# Patient Record
Sex: Male | Born: 1990 | Race: Black or African American | Hispanic: No | Marital: Single | Smoking: Current every day smoker
Health system: Southern US, Community
[De-identification: ages and names within clinical notes are randomized; demographics above are authoritative.]

## PROBLEM LIST (undated history)

## (undated) DIAGNOSIS — S0183XA Puncture wound without foreign body of other part of head, initial encounter: Secondary | ICD-10-CM

## (undated) DIAGNOSIS — R112 Nausea with vomiting, unspecified: Secondary | ICD-10-CM

## (undated) DIAGNOSIS — Z9889 Other specified postprocedural states: Secondary | ICD-10-CM

## (undated) DIAGNOSIS — W3400XA Accidental discharge from unspecified firearms or gun, initial encounter: Secondary | ICD-10-CM

---

## 2011-02-09 ENCOUNTER — Emergency Department (HOSPITAL_COMMUNITY)

## 2011-02-09 ENCOUNTER — Inpatient Hospital Stay (HOSPITAL_COMMUNITY)
Admission: EM | Admit: 2011-02-09 | Discharge: 2011-02-12 | DRG: 580 | Disposition: A | Discharge status: Home / Self Care | Attending: General Surgery | Admitting: General Surgery

## 2011-02-09 ENCOUNTER — Inpatient Hospital Stay (HOSPITAL_COMMUNITY)

## 2011-02-09 DIAGNOSIS — S02609B Fracture of mandible, unspecified, initial encounter for open fracture: Secondary | ICD-10-CM | POA: Diagnosis present

## 2011-02-09 DIAGNOSIS — I319 Disease of pericardium, unspecified: Secondary | ICD-10-CM | POA: Diagnosis present

## 2011-02-09 DIAGNOSIS — S0180XA Unspecified open wound of other part of head, initial encounter: Secondary | ICD-10-CM | POA: Diagnosis present

## 2011-02-09 DIAGNOSIS — S21109A Unspecified open wound of unspecified front wall of thorax without penetration into thoracic cavity, initial encounter: Principal | ICD-10-CM | POA: Diagnosis present

## 2011-02-09 DIAGNOSIS — R000 Tachycardia, unspecified: Secondary | ICD-10-CM | POA: Diagnosis present

## 2011-02-09 DIAGNOSIS — S0183XA Puncture wound without foreign body of other part of head, initial encounter: Secondary | ICD-10-CM

## 2011-02-09 DIAGNOSIS — S1190XA Unspecified open wound of unspecified part of neck, initial encounter: Secondary | ICD-10-CM | POA: Diagnosis present

## 2011-02-09 DIAGNOSIS — F17200 Nicotine dependence, unspecified, uncomplicated: Secondary | ICD-10-CM | POA: Diagnosis present

## 2011-02-09 DIAGNOSIS — S3690XA Unspecified injury of unspecified intra-abdominal organ, initial encounter: Secondary | ICD-10-CM

## 2011-02-09 DIAGNOSIS — Z781 Physical restraint status: Secondary | ICD-10-CM | POA: Diagnosis present

## 2011-02-09 DIAGNOSIS — Y998 Other external cause status: Secondary | ICD-10-CM

## 2011-02-09 HISTORY — PX: DIAGNOSTIC LAPAROSCOPY: SUR761

## 2011-02-09 HISTORY — DX: Puncture wound without foreign body of other part of head, initial encounter: S01.83XA

## 2011-02-09 HISTORY — PX: MANDIBLE FRACTURE SURGERY: SHX706

## 2011-02-09 LAB — CBC
HCT: 40.7 % (ref 39.0–52.0)
Hemoglobin: 14.6 g/dL (ref 13.0–17.0)
Hemoglobin: 13.7 g/dL (ref 13.0–17.0)
MCH: 31.1 pg (ref 26.0–34.0)
MCV: 89.1 fL (ref 78.0–100.0)
MCV: 88.1 fL (ref 78.0–100.0)
RBC: 4.69 MIL/uL (ref 4.22–5.81)
RBC: 4.62 MIL/uL (ref 4.22–5.81)
WBC: 10.8 10*3/uL — ABNORMAL HIGH (ref 4.0–10.5)
WBC: 12.4 10*3/uL — ABNORMAL HIGH (ref 4.0–10.5)

## 2011-02-09 LAB — COMPREHENSIVE METABOLIC PANEL
ALT: 16 U/L (ref 0–53)
AST: 25 U/L (ref 0–37)
Alkaline Phosphatase: 64 U/L (ref 39–117)
GFR calc Af Amer: >90 mL/min (ref >90)
Glucose, Bld: 141 mg/dL — ABNORMAL HIGH (ref 70–99)
Potassium: 2.9 mEq/L — ABNORMAL LOW (ref 3.5–5.1)
Sodium: 140 mEq/L (ref 135–145)
Total Protein: 7.5 g/dL (ref 6.0–8.3)

## 2011-02-09 LAB — BASIC METABOLIC PANEL
BUN: 8 mg/dL (ref 6–23)
Chloride: 107 mEq/L (ref 96–112)
GFR calc non Af Amer: >90 mL/min (ref >90)
Glucose, Bld: 102 mg/dL — ABNORMAL HIGH (ref 70–99)
Potassium: 3.6 mEq/L (ref 3.5–5.1)
Sodium: 141 mEq/L (ref 135–145)

## 2011-02-09 LAB — POCT I-STAT, CHEM 8
BUN: 10 mg/dL (ref 6–23)
Creatinine, Ser: 1.2 mg/dL (ref 0.50–1.35)
Hemoglobin: 15 g/dL (ref 13.0–17.0)
Potassium: 2.9 meq/L — ABNORMAL LOW (ref 3.5–5.1)
Sodium: 142 meq/L (ref 135–145)

## 2011-02-09 LAB — POCT I-STAT 3, ART BLOOD GAS (G3+)
Acid-Base Excess: 2 mmol/L (ref 0.0–2.0)
Bicarbonate: 22 meq/L (ref 20.0–24.0)
Bicarbonate: 27.4 meq/L — ABNORMAL HIGH (ref 20.0–24.0)
Bicarbonate: 25 meq/L — ABNORMAL HIGH (ref 20.0–24.0)
O2 Saturation: 100 %
O2 Saturation: 96 %
Patient temperature: 100
TCO2: 23 mmol/L (ref 0–100)
pCO2 arterial: 42.4 mmHg (ref 35.0–45.0)
pCO2 arterial: 44.3 mmHg (ref 35.0–45.0)
pCO2 arterial: 41.3 mmHg (ref 35.0–45.0)
pH, Arterial: 7.323 — ABNORMAL LOW (ref 7.350–7.450)
pO2, Arterial: 375 mmHg — ABNORMAL HIGH (ref 80.0–100.0)
pO2, Arterial: 202 mmHg — ABNORMAL HIGH (ref 80.0–100.0)
pO2, Arterial: 84 mmHg (ref 80.0–100.0)

## 2011-02-09 LAB — PROTIME-INR: Prothrombin Time: 13.9 seconds (ref 11.6–15.2)

## 2011-02-09 LAB — ABO/RH: ABO/RH(D): O POS

## 2011-02-09 LAB — LACTIC ACID, PLASMA: Lactic Acid, Venous: 7.3 mmol/L — ABNORMAL HIGH (ref 0.5–2.2)

## 2011-02-09 MED ORDER — IOHEXOL 350 MG/ML SOLN
50.0000 mL | Freq: Once | INTRAVENOUS | Status: AC | PRN
  Administered 2011-02-09: 50 mL via INTRAVENOUS
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
## 2011-02-10 ENCOUNTER — Inpatient Hospital Stay (HOSPITAL_COMMUNITY)

## 2011-02-10 LAB — BASIC METABOLIC PANEL
BUN: 5 mg/dL — ABNORMAL LOW (ref 6–23)
Chloride: 104 mEq/L (ref 96–112)
Creatinine, Ser: 0.82 mg/dL (ref 0.50–1.35)
Glucose, Bld: 112 mg/dL — ABNORMAL HIGH (ref 70–99)
Potassium: 3.4 mEq/L — ABNORMAL LOW (ref 3.5–5.1)

## 2011-02-10 LAB — CBC
HCT: 36.7 % — ABNORMAL LOW (ref 39.0–52.0)
Hemoglobin: 12.8 g/dL — ABNORMAL LOW (ref 13.0–17.0)
MCV: 88.2 fL (ref 78.0–100.0)
WBC: 9.3 10*3/uL (ref 4.0–10.5)
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
## 2011-02-12 LAB — POCT I-STAT 7, (LYTES, BLD GAS, ICA,H+H)
Acid-base deficit: 7 mmol/L — ABNORMAL HIGH (ref 0.0–2.0)
Bicarbonate: 17.8 meq/L — ABNORMAL LOW (ref 20.0–24.0)
HCT: 28 % — ABNORMAL LOW (ref 39.0–52.0)
O2 Saturation: 100 %
Sodium: 144 meq/L (ref 135–145)
TCO2: 19 mmol/L (ref 0–100)
pO2, Arterial: 479 mmHg — ABNORMAL HIGH (ref 80.0–100.0)

## 2011-02-13 LAB — TYPE AND SCREEN
ABO/RH(D): O POS
Unit division: 0

## 2011-02-17 ENCOUNTER — Telehealth: Payer: Self-pay | Admitting: Orthopedic Surgery
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
## 2011-02-20 ENCOUNTER — Encounter (INDEPENDENT_AMBULATORY_CARE_PROVIDER_SITE_OTHER): Payer: Self-pay

## 2011-02-20 ENCOUNTER — Ambulatory Visit (INDEPENDENT_AMBULATORY_CARE_PROVIDER_SITE_OTHER): Admitting: Physician Assistant

## 2011-02-20 DIAGNOSIS — S02609B Fracture of mandible, unspecified, initial encounter for open fracture: Secondary | ICD-10-CM

## 2011-02-20 DIAGNOSIS — S21109A Unspecified open wound of unspecified front wall of thorax without penetration into thoracic cavity, initial encounter: Secondary | ICD-10-CM

## 2011-02-20 DIAGNOSIS — S01409A Unspecified open wound of unspecified cheek and temporomandibular area, initial encounter: Secondary | ICD-10-CM

## 2011-02-20 DIAGNOSIS — S01439A Puncture wound without foreign body of unspecified cheek and temporomandibular area, initial encounter: Secondary | ICD-10-CM | POA: Insufficient documentation

## 2011-02-20 DIAGNOSIS — W3400XA Accidental discharge from unspecified firearms or gun, initial encounter: Secondary | ICD-10-CM | POA: Insufficient documentation

## 2011-02-20 DIAGNOSIS — S21139A Puncture wound without foreign body of unspecified front wall of thorax without penetration into thoracic cavity, initial encounter: Secondary | ICD-10-CM | POA: Insufficient documentation

## 2011-02-20 NOTE — Patient Instructions (Signed)
Shower and then pack chest wound withsaline gauze as instructed at least once daily and cover with dry gauze. Call next if wound does not seem to be healing better. [phone]
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
## 2011-02-24 ENCOUNTER — Telehealth: Payer: Self-pay | Admitting: Physician Assistant

## 2011-02-24 NOTE — Telephone Encounter (Signed)
Spoke with pt's mother who states he is nearly out of pain medication and she forgot to mention that at the office visit. Will refill the Roxicet 5/325mg /62ml- 5-10 mls po q4hrs prn pain #231mls no refill  She also reports that the chest wound is starting to scab over and I told her that if it does scab completely over that they don't have to try and pack it anymore.

## 2011-03-04 ENCOUNTER — Ambulatory Visit (HOSPITAL_COMMUNITY)
Admission: RE | Admit: 2011-03-04 | Discharge: 2011-03-04 | Disposition: A | Discharge status: Home / Self Care | Source: Ambulatory Visit | Attending: Otolaryngology | Admitting: Otolaryngology

## 2011-03-04 ENCOUNTER — Other Ambulatory Visit (HOSPITAL_COMMUNITY): Payer: Self-pay | Admitting: Otolaryngology

## 2011-03-04 ENCOUNTER — Other Ambulatory Visit: Payer: Self-pay | Admitting: Otolaryngology

## 2011-03-04 DIAGNOSIS — T148XXA Other injury of unspecified body region, initial encounter: Secondary | ICD-10-CM

## 2011-03-04 DIAGNOSIS — IMO0002 Reserved for concepts with insufficient information to code with codable children: Secondary | ICD-10-CM | POA: Insufficient documentation

## 2011-03-10 ENCOUNTER — Encounter (HOSPITAL_BASED_OUTPATIENT_CLINIC_OR_DEPARTMENT_OTHER): Payer: Self-pay | Admitting: *Deleted

## 2011-03-15 ENCOUNTER — Other Ambulatory Visit: Payer: Self-pay | Admitting: Otolaryngology

## 2011-03-17 ENCOUNTER — Encounter (HOSPITAL_BASED_OUTPATIENT_CLINIC_OR_DEPARTMENT_OTHER): Payer: Self-pay | Admitting: *Deleted

## 2011-03-17 ENCOUNTER — Ambulatory Visit (HOSPITAL_BASED_OUTPATIENT_CLINIC_OR_DEPARTMENT_OTHER): Admitting: Anesthesiology

## 2011-03-17 ENCOUNTER — Encounter (HOSPITAL_BASED_OUTPATIENT_CLINIC_OR_DEPARTMENT_OTHER)
Admission: RE | Disposition: A | Discharge status: Home / Self Care | Payer: Self-pay | Source: Ambulatory Visit | Attending: Otolaryngology

## 2011-03-17 ENCOUNTER — Ambulatory Visit (HOSPITAL_BASED_OUTPATIENT_CLINIC_OR_DEPARTMENT_OTHER)
Admission: RE | Admit: 2011-03-17 | Discharge: 2011-03-17 | Disposition: A | Discharge status: Home / Self Care | Source: Ambulatory Visit | Attending: Otolaryngology | Admitting: Otolaryngology

## 2011-03-17 ENCOUNTER — Encounter (HOSPITAL_BASED_OUTPATIENT_CLINIC_OR_DEPARTMENT_OTHER): Payer: Self-pay | Admitting: Anesthesiology

## 2011-03-17 DIAGNOSIS — F17200 Nicotine dependence, unspecified, uncomplicated: Secondary | ICD-10-CM | POA: Insufficient documentation

## 2011-03-17 DIAGNOSIS — Z4789 Encounter for other orthopedic aftercare: Secondary | ICD-10-CM | POA: Insufficient documentation

## 2011-03-17 DIAGNOSIS — Z01812 Encounter for preprocedural laboratory examination: Secondary | ICD-10-CM | POA: Insufficient documentation

## 2011-03-17 HISTORY — DX: Nausea with vomiting, unspecified: R11.2

## 2011-03-17 HISTORY — DX: Other specified postprocedural states: Z98.890

## 2011-03-17 HISTORY — PX: MANDIBULAR HARDWARE REMOVAL: SHX5205

## 2011-03-17 HISTORY — DX: Puncture wound without foreign body of other part of head, initial encounter: S01.83XA

## 2011-03-17 HISTORY — DX: Accidental discharge from unspecified firearms or gun, initial encounter: W34.00XA

## 2011-03-17 SURGERY — REMOVAL, HARDWARE, MANDIBLE
Anesthesia: General

## 2011-03-17 MED ORDER — ONDANSETRON HCL 4 MG/2ML IJ SOLN
INTRAMUSCULAR | Status: DC | PRN
  Administered 2011-03-17: 4 mg via INTRAVENOUS

## 2011-03-17 MED ORDER — HYDROMORPHONE HCL PF 1 MG/ML IJ SOLN
0.2500 mg | INTRAMUSCULAR | Status: DC | PRN
  Administered 2011-03-17 (×2): 0.5 mg via INTRAVENOUS

## 2011-03-17 MED ORDER — MIDAZOLAM HCL 5 MG/5ML IJ SOLN
INTRAMUSCULAR | Status: DC | PRN
  Administered 2011-03-17: 2 mg via INTRAVENOUS

## 2011-03-17 MED ORDER — MEPERIDINE HCL 25 MG/ML IJ SOLN: 6.2500 mg | INTRAMUSCULAR | Status: DC | PRN

## 2011-03-17 MED ORDER — OXYMETAZOLINE HCL 0.05 % NA SOLN: 2.0000 | NASAL | Status: AC

## 2011-03-17 MED ORDER — PROMETHAZINE HCL 25 MG/ML IJ SOLN: 6.2500 mg | INTRAMUSCULAR | Status: DC | PRN

## 2011-03-17 MED ORDER — DEXAMETHASONE SODIUM PHOSPHATE 4 MG/ML IJ SOLN
INTRAMUSCULAR | Status: DC | PRN
  Administered 2011-03-17: 10 mg via INTRAVENOUS

## 2011-03-17 MED ORDER — LIDOCAINE HCL (CARDIAC) 20 MG/ML IV SOLN
INTRAVENOUS | Status: DC | PRN
  Administered 2011-03-17: 50 mg via INTRAVENOUS

## 2011-03-17 MED ORDER — LACTATED RINGERS IV SOLN
INTRAVENOUS | Status: DC
  Administered 2011-03-17: 07:00:00 via INTRAVENOUS

## 2011-03-17 MED ORDER — CLINDAMYCIN PHOSPHATE 900 MG/50ML IV SOLN
900.0000 mg | Freq: Once | INTRAVENOUS | Status: AC
  Administered 2011-03-17: 900 mg via INTRAVENOUS

## 2011-03-17 MED ORDER — PROPOFOL 10 MG/ML IV EMUL
INTRAVENOUS | Status: DC | PRN
  Administered 2011-03-17: 200 mg via INTRAVENOUS

## 2011-03-17 MED ORDER — FENTANYL CITRATE 0.05 MG/ML IJ SOLN
INTRAMUSCULAR | Status: DC | PRN
  Administered 2011-03-17: 100 ug via INTRAVENOUS

## 2011-03-17 SURGICAL SUPPLY — 31 items
BLADE SURG 15 STRL LF DISP TIS (BLADE) ×1 IMPLANT
BLADE SURG 15 STRL SS (BLADE) ×1
CANISTER SUCTION 1200CC (MISCELLANEOUS) ×2 IMPLANT
CLOTH BEACON ORANGE TIMEOUT ST (SAFETY) ×2 IMPLANT
COVER MAYO STAND STRL (DRAPES) ×2 IMPLANT
DECANTER SPIKE VIAL GLASS SM (MISCELLANEOUS) ×2 IMPLANT
ELECT COATED BLADE 2.86 ST (ELECTRODE) IMPLANT
ELECT REM PT RETURN 9FT ADLT (ELECTROSURGICAL)
ELECTRODE REM PT RTRN 9FT ADLT (ELECTROSURGICAL) IMPLANT
GAUZE SPONGE 4X4 12PLY STRL LF (GAUZE/BANDAGES/DRESSINGS) ×4 IMPLANT
GLOVE BIO SURGEON STRL SZ 6.5 (GLOVE) ×2 IMPLANT
GLOVE ECLIPSE 8.0 STRL XLNG CF (GLOVE) ×4 IMPLANT
GOWN PREVENTION PLUS XLARGE (GOWN DISPOSABLE) ×2 IMPLANT
GOWN PREVENTION PLUS XXLARGE (GOWN DISPOSABLE) ×2 IMPLANT
MARKER SKIN DUAL TIP RULER LAB (MISCELLANEOUS) IMPLANT
NEEDLE 27GAX1X1/2 (NEEDLE) ×2 IMPLANT
NS IRRIG 1000ML POUR BTL (IV SOLUTION) ×2 IMPLANT
PACK BASIN DAY SURGERY FS (CUSTOM PROCEDURE TRAY) ×2 IMPLANT
PENCIL FOOT CONTROL (ELECTRODE) IMPLANT
SCISSORS WIRE ANG 4 3/4 DISP (INSTRUMENTS) IMPLANT
SHEET MEDIUM DRAPE 40X70 STRL (DRAPES) ×2 IMPLANT
SPONGE GAUZE 4X4 12PLY (GAUZE/BANDAGES/DRESSINGS) ×2 IMPLANT
SUCTION FRAZIER TIP 10 FR DISP (SUCTIONS) ×2 IMPLANT
SUT CHROMIC 3 0 PS 2 (SUTURE) IMPLANT
SUT CHROMIC 4 0 PS 2 18 (SUTURE) IMPLANT
SYR CONTROL 10ML LL (SYRINGE) ×2 IMPLANT
TOWEL OR 17X24 6PK STRL BLUE (TOWEL DISPOSABLE) ×4 IMPLANT
TRAY DSU PREP LF (CUSTOM PROCEDURE TRAY) IMPLANT
TUBE CONNECTING 20X1/4 (TUBING) ×2 IMPLANT
WATER STERILE IRR 1000ML POUR (IV SOLUTION) ×2 IMPLANT
YANKAUER SUCT BULB TIP NO VENT (SUCTIONS) ×2 IMPLANT

## 2011-03-17 NOTE — Anesthesia Preprocedure Evaluation (Signed)
Anesthesia Evaluation  Patient identified by MRN, date of birth, ID band Patient awake    Reviewed: Allergy & Precautions, H&P , NPO status , Patient's Chart, lab work & pertinent test results  Airway       Dental No notable dental hx. (+) Teeth Intact   Pulmonary neg pulmonary ROS, Current Smoker,  clear to auscultation  Pulmonary exam normal       Cardiovascular neg cardio ROS regular Normal    Neuro/Psych Negative Neurological ROS  Negative Psych ROS   GI/Hepatic negative GI ROS, Neg liver ROS,   Endo/Other  Negative Endocrine ROS  Renal/GU negative Renal ROS  Genitourinary negative   Musculoskeletal   Abdominal   Peds  Hematology negative hematology ROS (+)   Anesthesia Other Findings   Reproductive/Obstetrics negative OB ROS                           Anesthesia Physical Anesthesia Plan  ASA: II  Anesthesia Plan: General   Post-op Pain Management:    Induction: Intravenous  Airway Management Planned: Mask  Additional Equipment:   Intra-op Plan:   Post-operative Plan:   Informed Consent: I have reviewed the patients History and Physical, chart, labs and discussed the procedure including the risks, benefits and alternatives for the proposed anesthesia with the patient or authorized representative who has indicated his/her understanding and acceptance.     Plan Discussed with: CRNA  Anesthesia Plan Comments:         Anesthesia Quick Evaluation

## 2011-03-17 NOTE — Transfer of Care (Signed)
Immediate Anesthesia Transfer of Care Note  Patient: Robert Klein  Procedure(s) Performed:  MANDIBULAR HARDWARE REMOVAL - removal mandibularmaxillary fixation  Patient Location: PACU  Anesthesia Type: General  Level of Consciousness: awake  Airway & Oxygen Therapy: Patient Spontanous Breathing and Patient connected to face mask oxygen  Post-op Assessment: Report given to PACU RN  Post vital signs: Reviewed and stable  Complications: No apparent anesthesia complications

## 2011-03-17 NOTE — Anesthesia Postprocedure Evaluation (Signed)
  Anesthesia Post-op Note  Patient: Robert Klein  Procedure(s) Performed:  MANDIBULAR HARDWARE REMOVAL - removal mandibularmaxillary fixation  Patient Location: PACU  Anesthesia Type: General  Level of Consciousness: awake and sedated  Airway and Oxygen Therapy: Patient Spontanous Breathing and Patient connected to face mask oxygen  Post-op Pain: mild  Post-op Assessment: Post-op Vital signs reviewed, Patient's Cardiovascular Status Stable, Respiratory Function Stable and Patent Airway  Post-op Vital Signs: Reviewed and stable  Complications: No apparent anesthesia complications
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
## 2011-03-17 NOTE — Anesthesia Procedure Notes (Addendum)
Performed by: Zenia Resides D   Procedure Name: MAC Date/Time: 03/17/2011 8:00 AM Performed by: Zenia Resides D Pre-anesthesia Checklist: Patient identified, Emergency Drugs available, Suction available, Patient being monitored and Timeout performed Patient Re-evaluated:Patient Re-evaluated prior to inductionOxygen Delivery Method: Circle System Utilized Preoxygenation: Pre-oxygenation with 100% oxygen Intubation Type: IV induction Ventilation: Mask ventilation without difficulty and Mask ventilation throughout procedure Grade View: Grade I Dental Injury: Teeth and Oropharynx as per pre-operative assessment
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
## 2011-03-21 ENCOUNTER — Encounter (HOSPITAL_BASED_OUTPATIENT_CLINIC_OR_DEPARTMENT_OTHER): Payer: Self-pay | Admitting: Otolaryngology
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
## 2020-08-11 ENCOUNTER — Other Ambulatory Visit: Payer: Self-pay

## 2020-08-11 ENCOUNTER — Encounter (HOSPITAL_COMMUNITY): Payer: Self-pay | Admitting: *Deleted

## 2020-08-11 ENCOUNTER — Ambulatory Visit (HOSPITAL_COMMUNITY)
Admission: EM | Admit: 2020-08-11 | Discharge: 2020-08-11 | Disposition: A | Discharge status: Home / Self Care | Payer: 59 | Attending: Emergency Medicine | Admitting: Emergency Medicine

## 2020-08-11 DIAGNOSIS — K047 Periapical abscess without sinus: Secondary | ICD-10-CM

## 2020-08-11 DIAGNOSIS — R220 Localized swelling, mass and lump, head: Secondary | ICD-10-CM | POA: Diagnosis not present

## 2020-08-11 MED ORDER — IBUPROFEN 800 MG PO TABS: 800.0000 mg | ORAL_TABLET | Freq: Three times a day (TID) | ORAL | 0 refills | Status: DC

## 2020-08-11 MED ORDER — CLINDAMYCIN HCL 300 MG PO CAPS: 300.0000 mg | ORAL_CAPSULE | Freq: Two times a day (BID) | ORAL | 0 refills | Status: AC

## 2020-08-11 NOTE — Discharge Instructions (Signed)
Take the clindamycin twice a day for the next 10 days.  You may take ibuprofen as needed for pain.  Follow-up with dentist as scheduled on Monday.
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
## 2021-08-06 ENCOUNTER — Encounter (HOSPITAL_BASED_OUTPATIENT_CLINIC_OR_DEPARTMENT_OTHER): Payer: Self-pay | Admitting: Emergency Medicine

## 2021-08-06 ENCOUNTER — Emergency Department (HOSPITAL_BASED_OUTPATIENT_CLINIC_OR_DEPARTMENT_OTHER)
Admission: EM | Admit: 2021-08-06 | Discharge: 2021-08-06 | Disposition: A | Discharge status: Home / Self Care | Payer: 59 | Attending: Emergency Medicine | Admitting: Emergency Medicine

## 2021-08-06 ENCOUNTER — Other Ambulatory Visit: Payer: Self-pay

## 2021-08-06 DIAGNOSIS — I10 Essential (primary) hypertension: Secondary | ICD-10-CM | POA: Insufficient documentation

## 2021-08-06 DIAGNOSIS — K2900 Acute gastritis without bleeding: Secondary | ICD-10-CM | POA: Insufficient documentation

## 2021-08-06 DIAGNOSIS — I158 Other secondary hypertension: Secondary | ICD-10-CM | POA: Insufficient documentation

## 2021-08-06 LAB — CBC WITH DIFFERENTIAL/PLATELET
Abs Immature Granulocytes: 0.01 10*3/uL (ref 0.00–0.07)
Basophils Absolute: 0 10*3/uL (ref 0.0–0.1)
Basophils Relative: 1 %
Eosinophils Absolute: 0.2 10*3/uL (ref 0.0–0.5)
Eosinophils Relative: 3 %
HCT: 47.2 % (ref 39.0–52.0)
Hemoglobin: 16 g/dL (ref 13.0–17.0)
Immature Granulocytes: 0 %
Lymphocytes Relative: 38 %
Lymphs Abs: 2.3 10*3/uL (ref 0.7–4.0)
MCH: 30.4 pg (ref 26.0–34.0)
MCHC: 33.9 g/dL (ref 30.0–36.0)
MCV: 89.6 fL (ref 80.0–100.0)
Monocytes Absolute: 0.5 10*3/uL (ref 0.1–1.0)
Monocytes Relative: 8 %
Neutro Abs: 3 10*3/uL (ref 1.7–7.7)
Neutrophils Relative %: 50 %
Platelets: 239 10*3/uL (ref 150–400)
RBC: 5.27 MIL/uL (ref 4.22–5.81)
RDW: 13.3 % (ref 11.5–15.5)
WBC: 5.9 10*3/uL (ref 4.0–10.5)
nRBC: 0 % (ref 0.0–0.2)

## 2021-08-06 LAB — COMPREHENSIVE METABOLIC PANEL
ALT: 28 U/L (ref 0–44)
AST: 25 U/L (ref 15–41)
Albumin: 4.3 g/dL (ref 3.5–5.0)
Alkaline Phosphatase: 63 U/L (ref 38–126)
Anion gap: 7 (ref 5–15)
BUN: 16 mg/dL (ref 6–20)
CO2: 28 mmol/L (ref 22–32)
Calcium: 9.2 mg/dL (ref 8.9–10.3)
Chloride: 104 mmol/L (ref 98–111)
Creatinine, Ser: 1.15 mg/dL (ref 0.61–1.24)
GFR, Estimated: >60 mL/min (ref >60)
Glucose, Bld: 114 mg/dL — ABNORMAL HIGH (ref 70–99)
Potassium: 4.4 mmol/L (ref 3.5–5.1)
Sodium: 139 mmol/L (ref 135–145)
Total Bilirubin: 0.4 mg/dL (ref 0.3–1.2)
Total Protein: 7.4 g/dL (ref 6.5–8.1)

## 2021-08-06 LAB — LIPASE, BLOOD: Lipase: 26 U/L (ref 11–51)

## 2021-08-06 MED ORDER — LIDOCAINE VISCOUS HCL 2 % MT SOLN
15.0000 mL | Freq: Once | OROMUCOSAL | Status: AC
  Administered 2021-08-06: 15 mL via ORAL
  Filled 2021-08-06: qty 15

## 2021-08-06 MED ORDER — PROCHLORPERAZINE EDISYLATE 10 MG/2ML IJ SOLN
10.0000 mg | Freq: Once | INTRAMUSCULAR | Status: AC
  Administered 2021-08-06: 10 mg via INTRAVENOUS
  Filled 2021-08-06: qty 2

## 2021-08-06 MED ORDER — SODIUM CHLORIDE 0.9 % IV BOLUS
1000.0000 mL | Freq: Once | INTRAVENOUS | Status: AC
  Administered 2021-08-06: 1000 mL via INTRAVENOUS

## 2021-08-06 MED ORDER — HYOSCYAMINE SULFATE 0.125 MG SL SUBL
0.2500 mg | SUBLINGUAL_TABLET | Freq: Once | SUBLINGUAL | Status: AC
  Administered 2021-08-06: 0.25 mg via SUBLINGUAL
  Filled 2021-08-06: qty 2

## 2021-08-06 MED ORDER — ALUM & MAG HYDROXIDE-SIMETH 200-200-20 MG/5ML PO SUSP
30.0000 mL | Freq: Once | ORAL | Status: AC
  Administered 2021-08-06: 30 mL via ORAL
  Filled 2021-08-06: qty 30

## 2021-08-06 MED ORDER — PANTOPRAZOLE SODIUM 40 MG IV SOLR
40.0000 mg | Freq: Once | INTRAVENOUS | Status: AC
  Administered 2021-08-06: 40 mg via INTRAVENOUS
  Filled 2021-08-06: qty 10

## 2021-08-06 NOTE — ED Triage Notes (Signed)
Chronic abdominal worse tonight since about30 min ago.  ?

## 2021-08-06 NOTE — ED Notes (Signed)
Patient discharged to home.  All discharge instructions reviewed.  Patient verbalized understanding via teachback method.  VS WDL.  Respirations even and unlabored.  Ambulatory out of ED.   °
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
## 2022-10-09 ENCOUNTER — Other Ambulatory Visit: Payer: Self-pay

## 2022-10-09 ENCOUNTER — Emergency Department (HOSPITAL_BASED_OUTPATIENT_CLINIC_OR_DEPARTMENT_OTHER)
Admission: EM | Admit: 2022-10-09 | Discharge: 2022-10-09 | Disposition: A | Discharge status: Home / Self Care | Payer: Self-pay | Attending: Emergency Medicine | Admitting: Emergency Medicine

## 2022-10-09 ENCOUNTER — Encounter (HOSPITAL_BASED_OUTPATIENT_CLINIC_OR_DEPARTMENT_OTHER): Payer: Self-pay

## 2022-10-09 DIAGNOSIS — K029 Dental caries, unspecified: Secondary | ICD-10-CM | POA: Insufficient documentation

## 2022-10-09 DIAGNOSIS — K047 Periapical abscess without sinus: Secondary | ICD-10-CM | POA: Insufficient documentation

## 2022-10-09 MED ORDER — CLINDAMYCIN HCL 150 MG PO CAPS
300.0000 mg | ORAL_CAPSULE | Freq: Once | ORAL | Status: AC
  Administered 2022-10-09: 300 mg via ORAL
  Filled 2022-10-09: qty 2

## 2022-10-09 MED ORDER — IBUPROFEN 600 MG PO TABS: 600.0000 mg | ORAL_TABLET | Freq: Four times a day (QID) | ORAL | 0 refills | Status: AC | PRN

## 2022-10-09 MED ORDER — IBUPROFEN 400 MG PO TABS
600.0000 mg | ORAL_TABLET | Freq: Once | ORAL | Status: AC
  Administered 2022-10-09: 600 mg via ORAL
  Filled 2022-10-09: qty 1

## 2022-10-09 MED ORDER — CLINDAMYCIN HCL 150 MG PO CAPS: 300.0000 mg | ORAL_CAPSULE | Freq: Four times a day (QID) | ORAL | 0 refills | Status: AC

## 2022-10-09 MED ORDER — HYDROCODONE-ACETAMINOPHEN 5-325 MG PO TABS: 1.0000 | ORAL_TABLET | Freq: Four times a day (QID) | ORAL | 0 refills | Status: AC | PRN

## 2022-10-09 NOTE — ED Provider Notes (Signed)
   [place] [facility] AT [facility]  Provider Note  CSN: [id number] Arrival date & time: 10/09/22 0000  History Chief Complaint  Patient presents with   Dental Pain    Robert Klein is a 32 y.o. male here with L lower toothache started yesterday morning, getting worse over the course of the day. No fever or drainage.    Home Medications Prior to Admission medications   Medication Sig Start Date End Date Taking? Authorizing Provider  clindamycin (CLEOCIN) 150 MG capsule Take 2 capsules (300 mg total) by mouth 4 (four) times daily. 10/09/22  Yes Pollyann Savoy, MD  HYDROcodone-acetaminophen (NORCO/VICODIN) 5-325 MG tablet Take 1 tablet by mouth every 6 (six) hours as needed for severe pain. 10/09/22  Yes Pollyann Savoy, MD  ibuprofen (ADVIL) 600 MG tablet Take 1 tablet (600 mg total) by mouth every 6 (six) hours as needed. 10/09/22  Yes Pollyann Savoy, MD     Allergies    Amoxicillin   Review of Systems   Review of Systems Please see HPI for pertinent positives and negatives  Physical Exam BP (!) 173/112 (BP Location: Left Arm)   Pulse 88   Temp 98.6 F (37 C)   Resp 18   Ht 6\' 3"  (1.905 m)   Wt 98.9 kg   SpO2 100%   BMI 27.25 kg/m   Physical Exam Vitals and nursing note reviewed.  HENT:     Head: Normocephalic.     Nose: Nose normal.     Mouth/Throat:     Comments: Patient with dental caries to L lower 2nd bicuspid, mild erythema, no fluctuance Eyes:     Extraocular Movements: Extraocular movements intact.  Pulmonary:     Effort: Pulmonary effort is normal.  Musculoskeletal:        General: Normal range of motion.     Cervical back: Neck supple.  Skin:    Findings: No rash (on exposed skin).  Neurological:     Mental Status: He is alert and oriented to person, place, and time.  Psychiatric:        Mood and Affect: Mood normal.     ED Results / Procedures / Treatments   EKG None  Procedures Procedures  Medications  Ordered in the ED Medications  ibuprofen (ADVIL) tablet 600 mg (has no administration in time range)  clindamycin (CLEOCIN) capsule 300 mg (has no administration in time range)    Initial Impression and Plan  Patient here with toothache, likely infection. Will begin Abx, pain meds as needed. He will call his dentist tomorrow.   ED Course       MDM Rules/Calculators/A&P Medical Decision Making Problems Addressed: Dental abscess: acute illness or injury  Risk Prescription drug management.     Final Clinical Impression(s) / ED Diagnoses Final diagnoses:  Dental abscess    Rx / DC Orders ED Discharge Orders          Ordered    clindamycin (CLEOCIN) 150 MG capsule  4 times daily        10/09/22 0036    HYDROcodone-acetaminophen (NORCO/VICODIN) 5-325 MG tablet  Every 6 hours PRN        10/09/22 0037    ibuprofen (ADVIL) 600 MG tablet  Every 6 hours PRN        10/09/22 0037             Pollyann Savoy, MD 10/09/22 [phone]

## 2022-10-09 NOTE — ED Triage Notes (Signed)
Pt arrives with c/o left lower dental pain that started yesterday. Pt denies fevers.
